# Patient Record
Sex: Female | Born: 1990 | Hispanic: Yes
Health system: Southern US, Community
[De-identification: ages and names within clinical notes are randomized; demographics above are authoritative.]

---

## 2013-03-15 ENCOUNTER — Emergency Department: Payer: Self-pay | Admitting: Emergency Medicine

## 2013-03-15 LAB — CBC WITH DIFFERENTIAL/PLATELET
Basophil #: 0.1 10*3/uL (ref 0.0–0.1)
Basophil %: 0.5 %
HCT: 38.2 % (ref 35.0–47.0)
Lymphocyte %: 12 %
MCH: 27.9 pg (ref 26.0–34.0)
MCHC: 33.2 g/dL (ref 32.0–36.0)
MCV: 84 fL (ref 80–100)
Monocyte %: 3.4 %
Neutrophil #: 13 10*3/uL — ABNORMAL HIGH (ref 1.4–6.5)
Neutrophil %: 83.8 %
Platelet: 180 10*3/uL (ref 150–440)
RBC: 4.53 10*6/uL (ref 3.80–5.20)
RDW: 13.8 % (ref 11.5–14.5)

## 2013-03-16 LAB — BASIC METABOLIC PANEL
Anion Gap: 7 (ref 7–16)
Calcium, Total: 8.9 mg/dL (ref 8.5–10.1)
Chloride: 103 mmol/L (ref 98–107)
Co2: 26 mmol/L (ref 21–32)
Creatinine: 0.86 mg/dL (ref 0.60–1.30)
EGFR (African American): >60
EGFR (Non-African Amer.): >60
Glucose: 126 mg/dL — ABNORMAL HIGH (ref 65–99)
Osmolality: 275 (ref 275–301)
Potassium: 4 mmol/L (ref 3.5–5.1)
Sodium: 136 mmol/L (ref 136–145)

## 2013-03-16 LAB — LIPASE, BLOOD: Lipase: 108 U/L (ref 73–393)

## 2013-03-16 LAB — HCG, QUANTITATIVE, PREGNANCY: Beta Hcg, Quant.: 8039 m[IU]/mL — ABNORMAL HIGH

## 2013-07-22 ENCOUNTER — Inpatient Hospital Stay: Payer: Self-pay | Admitting: Obstetrics and Gynecology

## 2013-07-22 LAB — URINALYSIS, COMPLETE
Bacteria: NONE SEEN
Bilirubin,UR: NEGATIVE
Blood: NEGATIVE
GLUCOSE, UR: NEGATIVE mg/dL (ref 0–75)
Leukocyte Esterase: NEGATIVE
Nitrite: NEGATIVE
PROTEIN: NEGATIVE
Ph: 5 (ref 4.5–8.0)
SPECIFIC GRAVITY: 1.02 (ref 1.003–1.030)
Squamous Epithelial: <1

## 2013-07-22 LAB — CBC WITH DIFFERENTIAL/PLATELET
BASOS PCT: 0.4 %
Basophil #: 0.1 10*3/uL (ref 0.0–0.1)
Basophil #: 0.1 10*3/uL (ref 0.0–0.1)
Basophil %: 0.7 %
EOS ABS: 0 10*3/uL (ref 0.0–0.7)
EOS PCT: 0.1 %
Eosinophil #: 0 10*3/uL (ref 0.0–0.7)
Eosinophil %: 0 %
HCT: 34.5 % — ABNORMAL LOW (ref 35.0–47.0)
HCT: 32.2 % — AB (ref 35.0–47.0)
HGB: 11.2 g/dL — ABNORMAL LOW (ref 12.0–16.0)
HGB: 10.7 g/dL — AB (ref 12.0–16.0)
LYMPHS ABS: 1.2 10*3/uL (ref 1.0–3.6)
Lymphocyte #: 0.9 10*3/uL — ABNORMAL LOW (ref 1.0–3.6)
Lymphocyte %: 6.6 %
Lymphocyte %: 5.3 %
MCH: 27.7 pg (ref 26.0–34.0)
MCH: 27.9 pg (ref 26.0–34.0)
MCHC: 32.5 g/dL (ref 32.0–36.0)
MCHC: 33.1 g/dL (ref 32.0–36.0)
MCV: 86 fL (ref 80–100)
MCV: 84 fL (ref 80–100)
MONO ABS: 0.5 x10 3/mm (ref 0.2–0.9)
Monocyte #: 0.7 x10 3/mm (ref 0.2–0.9)
Monocyte %: 2.6 %
Monocyte %: 3.8 %
NEUTROS ABS: 16.6 10*3/uL — AB (ref 1.4–6.5)
NEUTROS PCT: 90.2 %
Neutrophil #: 15.3 10*3/uL — ABNORMAL HIGH (ref 1.4–6.5)
Neutrophil %: 90.3 %
PLATELETS: 177 10*3/uL (ref 150–440)
PLATELETS: 142 10*3/uL — AB (ref 150–440)
RBC: 4.03 10*6/uL (ref 3.80–5.20)
RBC: 3.83 10*6/uL (ref 3.80–5.20)
RDW: 14.2 % (ref 11.5–14.5)
RDW: 14.1 % (ref 11.5–14.5)
WBC: 18.4 10*3/uL — ABNORMAL HIGH (ref 3.6–11.0)
WBC: 17 10*3/uL — AB (ref 3.6–11.0)

## 2013-07-22 LAB — COMPREHENSIVE METABOLIC PANEL
ALK PHOS: 55 U/L
Albumin: 3.2 g/dL — ABNORMAL LOW (ref 3.4–5.0)
Anion Gap: 7 (ref 7–16)
BILIRUBIN TOTAL: 0.6 mg/dL (ref 0.2–1.0)
BUN: 19 mg/dL — ABNORMAL HIGH (ref 7–18)
CALCIUM: 8.1 mg/dL — AB (ref 8.5–10.1)
CHLORIDE: 108 mmol/L — AB (ref 98–107)
CO2: 23 mmol/L (ref 21–32)
Creatinine: 0.84 mg/dL (ref 0.60–1.30)
EGFR (African American): >60
EGFR (Non-African Amer.): >60
GLUCOSE: 177 mg/dL — AB (ref 65–99)
Osmolality: 282 (ref 275–301)
POTASSIUM: 4.3 mmol/L (ref 3.5–5.1)
SGOT(AST): 17 U/L (ref 15–37)
SGPT (ALT): 16 U/L (ref 12–78)
SODIUM: 138 mmol/L (ref 136–145)
TOTAL PROTEIN: 6.9 g/dL (ref 6.4–8.2)

## 2013-07-22 LAB — HCG, QUANTITATIVE, PREGNANCY: Beta Hcg, Quant.: 2910 m[IU]/mL — ABNORMAL HIGH

## 2013-07-22 LAB — TROPONIN I: Troponin-I: <0.02 ng/mL

## 2013-07-22 LAB — PROTIME-INR
INR: 1.1
Prothrombin Time: 14.4 secs (ref 11.5–14.7)

## 2013-07-22 LAB — PHOSPHORUS: Phosphorus: 3.3 mg/dL (ref 2.5–4.9)

## 2013-07-22 LAB — MAGNESIUM: MAGNESIUM: 1.6 mg/dL — AB

## 2013-07-23 LAB — BASIC METABOLIC PANEL
Anion Gap: 6 — ABNORMAL LOW (ref 7–16)
BUN: 9 mg/dL (ref 7–18)
Calcium, Total: 6.8 mg/dL — CL (ref 8.5–10.1)
Chloride: 106 mmol/L (ref 98–107)
Co2: 24 mmol/L (ref 21–32)
Creatinine: 0.65 mg/dL (ref 0.60–1.30)
EGFR (African American): >60
EGFR (Non-African Amer.): >60
GLUCOSE: 160 mg/dL — AB (ref 65–99)
Osmolality: 274 (ref 275–301)
Potassium: 3.6 mmol/L (ref 3.5–5.1)
Sodium: 136 mmol/L (ref 136–145)

## 2013-07-23 LAB — CBC WITH DIFFERENTIAL/PLATELET
BASOS ABS: 0 10*3/uL (ref 0.0–0.1)
BASOS PCT: 0.3 %
EOS PCT: 0.1 %
Eosinophil #: 0 10*3/uL (ref 0.0–0.7)
HCT: 29.3 % — AB (ref 35.0–47.0)
HGB: 9.9 g/dL — AB (ref 12.0–16.0)
LYMPHS PCT: 10.8 %
Lymphocyte #: 1.2 10*3/uL (ref 1.0–3.6)
MCH: 28.2 pg (ref 26.0–34.0)
MCHC: 33.7 g/dL (ref 32.0–36.0)
MCV: 84 fL (ref 80–100)
MONO ABS: 0.7 x10 3/mm (ref 0.2–0.9)
Monocyte %: 6.5 %
Neutrophil #: 8.8 10*3/uL — ABNORMAL HIGH (ref 1.4–6.5)
Neutrophil %: 82.3 %
Platelet: 134 10*3/uL — ABNORMAL LOW (ref 150–440)
RBC: 3.49 10*6/uL — ABNORMAL LOW (ref 3.80–5.20)
RDW: 14.4 % (ref 11.5–14.5)
WBC: 10.6 10*3/uL (ref 3.6–11.0)

## 2013-07-23 LAB — HCG, QUANTITATIVE, PREGNANCY: Beta Hcg, Quant.: 1099 m[IU]/mL — ABNORMAL HIGH

## 2013-07-23 LAB — MAGNESIUM: MAGNESIUM: 1.3 mg/dL — AB

## 2013-07-24 LAB — COMPREHENSIVE METABOLIC PANEL
ALT: 14 U/L (ref 12–78)
AST: 17 U/L (ref 15–37)
Albumin: 2.3 g/dL — ABNORMAL LOW (ref 3.4–5.0)
Alkaline Phosphatase: 43 U/L — ABNORMAL LOW
Anion Gap: 3 — ABNORMAL LOW (ref 7–16)
BUN: 5 mg/dL — ABNORMAL LOW (ref 7–18)
Bilirubin,Total: 0.8 mg/dL (ref 0.2–1.0)
Calcium, Total: 7.2 mg/dL — ABNORMAL LOW (ref 8.5–10.1)
Chloride: 110 mmol/L — ABNORMAL HIGH (ref 98–107)
Co2: 26 mmol/L (ref 21–32)
Creatinine: 0.68 mg/dL (ref 0.60–1.30)
EGFR (African American): >60
EGFR (Non-African Amer.): >60
Glucose: 106 mg/dL — ABNORMAL HIGH (ref 65–99)
Osmolality: 275 (ref 275–301)
POTASSIUM: 3.3 mmol/L — AB (ref 3.5–5.1)
SODIUM: 139 mmol/L (ref 136–145)
Total Protein: 5.5 g/dL — ABNORMAL LOW (ref 6.4–8.2)

## 2013-07-24 LAB — CBC WITH DIFFERENTIAL/PLATELET
BASOS PCT: 0.6 %
Basophil #: 0.1 10*3/uL (ref 0.0–0.1)
EOS ABS: 0.1 10*3/uL (ref 0.0–0.7)
Eosinophil %: 0.9 %
HCT: 27.5 % — ABNORMAL LOW (ref 35.0–47.0)
HGB: 9.1 g/dL — AB (ref 12.0–16.0)
Lymphocyte #: 1.3 10*3/uL (ref 1.0–3.6)
Lymphocyte %: 14.5 %
MCH: 28.2 pg (ref 26.0–34.0)
MCHC: 33.3 g/dL (ref 32.0–36.0)
MCV: 85 fL (ref 80–100)
MONO ABS: 0.6 x10 3/mm (ref 0.2–0.9)
MONOS PCT: 6.9 %
Neutrophil #: 6.9 10*3/uL — ABNORMAL HIGH (ref 1.4–6.5)
Neutrophil %: 77.1 %
PLATELETS: 132 10*3/uL — AB (ref 150–440)
RBC: 3.24 10*6/uL — ABNORMAL LOW (ref 3.80–5.20)
RDW: 14.3 % (ref 11.5–14.5)
WBC: 9 10*3/uL (ref 3.6–11.0)

## 2013-07-24 LAB — MAGNESIUM: Magnesium: 1.8 mg/dL

## 2013-07-24 LAB — URINE CULTURE

## 2013-07-24 LAB — PATHOLOGY REPORT

## 2013-07-25 LAB — COMPREHENSIVE METABOLIC PANEL
ALBUMIN: 2.3 g/dL — AB (ref 3.4–5.0)
ALT: 14 U/L (ref 12–78)
Alkaline Phosphatase: 43 U/L — ABNORMAL LOW
Anion Gap: 3 — ABNORMAL LOW (ref 7–16)
BILIRUBIN TOTAL: 0.8 mg/dL (ref 0.2–1.0)
BUN: 4 mg/dL — AB (ref 7–18)
CO2: 28 mmol/L (ref 21–32)
CREATININE: 0.55 mg/dL — AB (ref 0.60–1.30)
Calcium, Total: 7.5 mg/dL — ABNORMAL LOW (ref 8.5–10.1)
Chloride: 110 mmol/L — ABNORMAL HIGH (ref 98–107)
EGFR (African American): >60
Glucose: 101 mg/dL — ABNORMAL HIGH (ref 65–99)
Osmolality: 278 (ref 275–301)
POTASSIUM: 3.8 mmol/L (ref 3.5–5.1)
SGOT(AST): 14 U/L — ABNORMAL LOW (ref 15–37)
SODIUM: 141 mmol/L (ref 136–145)
Total Protein: 5.5 g/dL — ABNORMAL LOW (ref 6.4–8.2)

## 2013-07-25 LAB — CBC WITH DIFFERENTIAL/PLATELET
BASOS PCT: 0.7 %
Basophil #: 0 10*3/uL (ref 0.0–0.1)
EOS ABS: 0.2 10*3/uL (ref 0.0–0.7)
Eosinophil %: 2.4 %
HCT: 26.6 % — AB (ref 35.0–47.0)
HGB: 8.8 g/dL — AB (ref 12.0–16.0)
Lymphocyte #: 1.7 10*3/uL (ref 1.0–3.6)
Lymphocyte %: 25.8 %
MCH: 28.5 pg (ref 26.0–34.0)
MCHC: 33.3 g/dL (ref 32.0–36.0)
MCV: 86 fL (ref 80–100)
MONO ABS: 0.4 x10 3/mm (ref 0.2–0.9)
Monocyte %: 6.7 %
Neutrophil #: 4.3 10*3/uL (ref 1.4–6.5)
Neutrophil %: 64.4 %
Platelet: 128 10*3/uL — ABNORMAL LOW (ref 150–440)
RBC: 3.11 10*6/uL — AB (ref 3.80–5.20)
RDW: 14.3 % (ref 11.5–14.5)
WBC: 6.6 10*3/uL (ref 3.6–11.0)

## 2013-07-25 LAB — MAGNESIUM: MAGNESIUM: 1.7 mg/dL — AB

## 2013-07-26 ENCOUNTER — Emergency Department: Payer: Self-pay | Admitting: Emergency Medicine

## 2013-07-26 LAB — COMPREHENSIVE METABOLIC PANEL
ALK PHOS: 54 U/L
ALT: 17 U/L (ref 12–78)
Albumin: 2.8 g/dL — ABNORMAL LOW (ref 3.4–5.0)
Anion Gap: 7 (ref 7–16)
BILIRUBIN TOTAL: 1.3 mg/dL — AB (ref 0.2–1.0)
BUN: 8 mg/dL (ref 7–18)
CALCIUM: 8.3 mg/dL — AB (ref 8.5–10.1)
CHLORIDE: 108 mmol/L — AB (ref 98–107)
Co2: 25 mmol/L (ref 21–32)
Creatinine: 0.71 mg/dL (ref 0.60–1.30)
EGFR (African American): >60
GLUCOSE: 91 mg/dL (ref 65–99)
Osmolality: 277 (ref 275–301)
Potassium: 3.7 mmol/L (ref 3.5–5.1)
SGOT(AST): 16 U/L (ref 15–37)
SODIUM: 140 mmol/L (ref 136–145)
TOTAL PROTEIN: 6.4 g/dL (ref 6.4–8.2)

## 2013-07-26 LAB — CBC WITH DIFFERENTIAL/PLATELET
BASOS ABS: 0.1 10*3/uL (ref 0.0–0.1)
Basophil %: 0.6 %
EOS PCT: 2.5 %
Eosinophil #: 0.2 10*3/uL (ref 0.0–0.7)
HCT: 29.4 % — ABNORMAL LOW (ref 35.0–47.0)
HGB: 9.6 g/dL — ABNORMAL LOW (ref 12.0–16.0)
LYMPHS ABS: 2.6 10*3/uL (ref 1.0–3.6)
LYMPHS PCT: 29.8 %
MCH: 27.6 pg (ref 26.0–34.0)
MCHC: 32.7 g/dL (ref 32.0–36.0)
MCV: 84 fL (ref 80–100)
MONO ABS: 0.5 x10 3/mm (ref 0.2–0.9)
Monocyte %: 5.4 %
Neutrophil #: 5.4 10*3/uL (ref 1.4–6.5)
Neutrophil %: 61.7 %
PLATELETS: 157 10*3/uL (ref 150–440)
RBC: 3.49 10*6/uL — ABNORMAL LOW (ref 3.80–5.20)
RDW: 13.9 % (ref 11.5–14.5)
WBC: 8.8 10*3/uL (ref 3.6–11.0)

## 2013-07-26 LAB — URINALYSIS, COMPLETE
BILIRUBIN, UR: NEGATIVE
GLUCOSE, UR: NEGATIVE mg/dL (ref 0–75)
Ketone: NEGATIVE
Leukocyte Esterase: NEGATIVE
NITRITE: NEGATIVE
PH: 7 (ref 4.5–8.0)
PROTEIN: NEGATIVE
SPECIFIC GRAVITY: 1.002 (ref 1.003–1.030)
Squamous Epithelial: 3
WBC UR: 3 /HPF (ref 0–5)

## 2013-07-26 LAB — PROTIME-INR
INR: 1
Prothrombin Time: 13.5 secs (ref 11.5–14.7)

## 2013-07-26 LAB — CK TOTAL AND CKMB (NOT AT ARMC)
CK, Total: 217 U/L — ABNORMAL HIGH
CK-MB: 0.6 ng/mL (ref 0.5–3.6)

## 2013-07-26 LAB — TROPONIN I: Troponin-I: <0.02 ng/mL

## 2013-07-27 LAB — CULTURE, BLOOD (SINGLE)

## 2014-08-10 NOTE — Op Note (Signed)
PATIENT NAME:  Glenda Rivera, Kaetlin MR#:  [id number] DATE OF BIRTH:  [birth year]  DATE OF PROCEDURE:  07/22/2013  PREOPERATIVE DIAGNOSIS: Ruptured ectopic pregnancy with a hemoperitoneum.   POSTOPERATIVE DIAGNOSES:  Ruptured ectopic pregnancy with a hemoperitoneum.  Appears to be right ovarian ectopic.   OPERATION PERFORMED: Right salpingo-oophorectomy via laparotomy.   ANESTHESIA USED: General.   PRIMARY SURGEON: Florina OuAndreas M. Bonney AidStaebler, MD  ESTIMATED BLOOD LOSS: 300 mL of hemoperitoneum and clot.   OPERATIVE FLUIDS: 1 liter of crystalloid.   URINE OUTPUT: 250 mL of urine output.   PREOPERATIVE ANTIBIOTICS: 1 gram of Ancef.   DRAINS OR TUBES:  Foley to gravity drainage.   IMPLANTS: None.   OTHER: 1 gram of Arista was applied to the right ovarian fossa.   INTRAOPERATIVE FINDINGS: Right ovary actively hemorrhaging upon establishing entry into the peritoneal cavity. There was some dilation in the right fallopian tube as well with significant reactive changes throughout the right ovarian fossa. The left tube and ovary appeared grossly normal. The uterus appeared normal as well. Of note, the patient also at the time of prep was noted to have foreign material versus something along the lines of pilonidal cyst in her umbilicus which was removed during cleaning of the umbilicus and sent to pathology.   COMPLICATIONS: None.   SPECIMENS REMOVED: Right tube and ovary as well as umbilical fibrous tissue.   CONDITION FOLLOWING THE PROCEDURE: Stable.   PROCEDURE IN DETAIL: Risks, benefits, and alternatives of the procedure were discussed with the patient prior to proceeding to the operating room. The patient had become hypotensive in the Emergency Department and had already received 1 liter of the emergency release  O negative blood by the time she was taken to the operating room. CT findings revealed a large amount of hemoperitoneum tracking from the pelvis up to the dome of the liver. There was no  definitively identified ectopic.  Her beta-hCG was 2000. The patient was emergently taken to the operating room and placed under general endotracheal anesthesia, prepped and draped in the usual sterile fashion, positioned in the supine position.  A Pfannenstiel skin incision was made on the skin using a scalpel carried down to the level of the rectus fascia using the knife. The fascia was incised in the midline with the knife and extended using Mayo scissors. The superior border of the rectus fascia was grasped with 2 Kocher clamps. The underlying rectus muscles were dissected off the fascia using blunt dissection. This was then repeated for the inferior border of the fascia in a similar fashion. The peritoneum was entered bluntly. Upon entering the peritoneum, a Balfour retractor was placed along with a Balfour extender to allow visualization. Inspection of the abdomen revealed the above findings. The left tube and ovary were grossly normal. The right tube had a small area of dilation and there was active hemorrhage from the right ovary. The ovarian fossa had appeared to be potentially involved at the site of implantation with significant reactive changes throughout the right ovarian fossa. The hemoperitoneum was evacuated using suction. Following this, the ovary was elevated using a Babcock clamp and then clamped using a Heaney clamp before being excised and suture ligated using a 0 Vicryl stitch. The left tube and ovary were once again inspected noting to be grossly normal in appearance. On inspection of the right tube, there was a dilation noted in the distal portion of the tube. Given that I could not exclude this as a potential site of the  ectopic as well, decision was made to remove the right distal portion of tube by elevating with a Babcock clamp and placing a free tie of 0 Vicryl underneath it before excising the specimen using Metzenbaum scissors. The pelvis was then once again inspected and noted to be  hemostatic. Given the reactive changes in the right ovarian fossa, 1 gram of Arista powder was applied to ensure hemostasis after closure. The muscles were reapproximated in the midline using a 0 Vicryl mattress stitch. The fascia was closed using a looped #1 PDS in a running fashion. The subcutaneous tissue was inspected and hemostasis achieved using the Bovie. Subcutaneous dead space in the skin was reapproximated using interrupted 2-0 Vicryls. The skin was closed using staples. Sponge, needle, and instrument counts were correct x 2. The patient tolerated procedure well and was taken to the recovery room in stable condition.      ____________________________ Florina Ou. Bonney Aid, MD ams:dd D: 07/22/2013 18:19:55 ET T: 07/22/2013 20:09:17 ET JOB#: [id number]  cc: Florina Ou. Bonney Aid, MD, <Dictator> [place] Cathrine Muster MD ELECTRONICALLY SIGNED 07/26/2013 9:26

## 2014-08-27 NOTE — H&P (Signed)
L&D Evaluation:  History:  HPI Patient is a 24 yo G1 presenting acutely to the ER with abdominal pain and vaginal bleeding, reports LMP of 4.1.15,  She states she has never had intercourse but BHCG is in the 2000's, CT scan reveals blood originating in pelvis and tracking up to the liver.  NPO since yesterday evening  GYN history - denies STI or abnormal paps, no prior abdominal/tubal surgery, no history of prior ectopics   Presents with abdominal pain   Patient's Medical History No Chronic Illness   Patient's Surgical History none   Medications none   Allergies NKDA   Social History none   Family History Non-Contributory   ROS:  ROS All systems were reviewed.  HEENT, CNS, GI, GU, Respiratory, CV, Renal and Musculoskeletal systems were found to be normal.   Exam:  Vital Signs stable   Urine Protein not completed   General no apparent distress   Mental Status clear   Chest clear   Heart normal sinus rhythm   Abdomen Hypoactive BS, guarding and rebound, RLQ tenderness   Edema no edema   Other admission Hgb 11.2, PT 1.1, BHCG 2000   Impression:  Impression 24 yo G1P0010 with unstable ruptured ectopic pregnancy and hemoperitoneum   Plan:  Comments - proceed to OR for laparotomy - 1 unit of emergency release blood - T&S pending may need rhogam - SCD's   Electronic Signatures: Lorrene ReidStaebler, Daryle Amis M (MD)  (Signed 05-Apr-15 15:20)  Authored: L&D Evaluation   Last Updated: 05-Apr-15 15:20 by Lorrene ReidStaebler, Angeleah Labrake M (MD)

## 2014-11-07 IMAGING — US US OB < 14 WEEKS - US OB TV
1 series · 14 of 28 positions shown · non-contrast
Comparison: None.

CLINICAL DATA: Right pelvic pain. Heavy vaginal bleeding. Uncertain
LMP.

EXAM:
OBSTETRIC <14 WK US AND TRANSVAGINAL OB US
TECHNIQUE: Both transabdominal and transvaginal ultrasound examinations were
performed for complete evaluation of the gestation as well as the
maternal uterus, adnexal regions, and pelvic cul-de-sac.
Transvaginal technique was performed to assess early pregnancy.

[Series 1: us ob < 14 weeks - us ob tv · 0.22mm/px · 41 acquisitions, 14 frames shown]
[im 2/41]
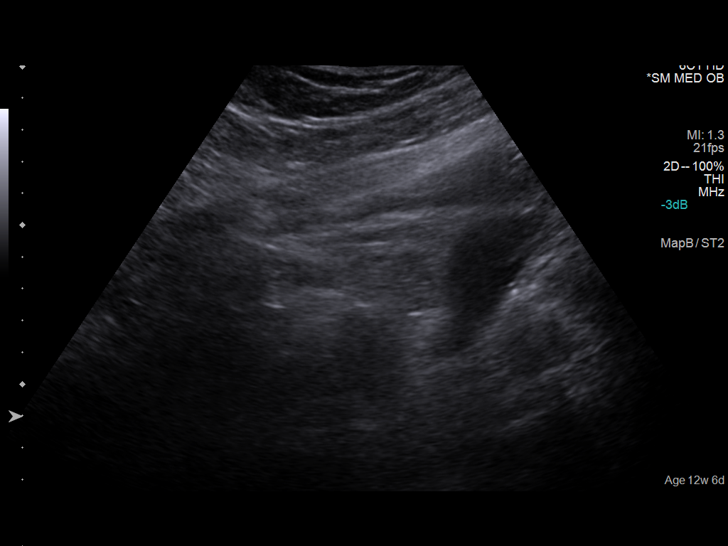
[im 5/41]
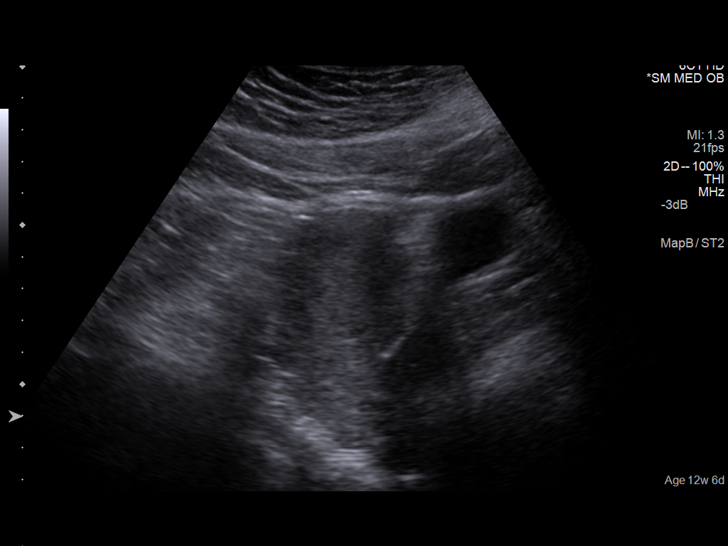
[im 8/41]
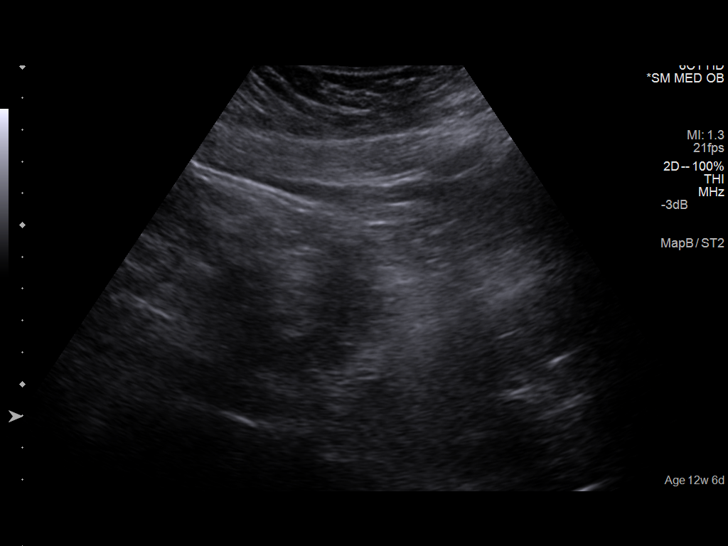
[im 11/41]
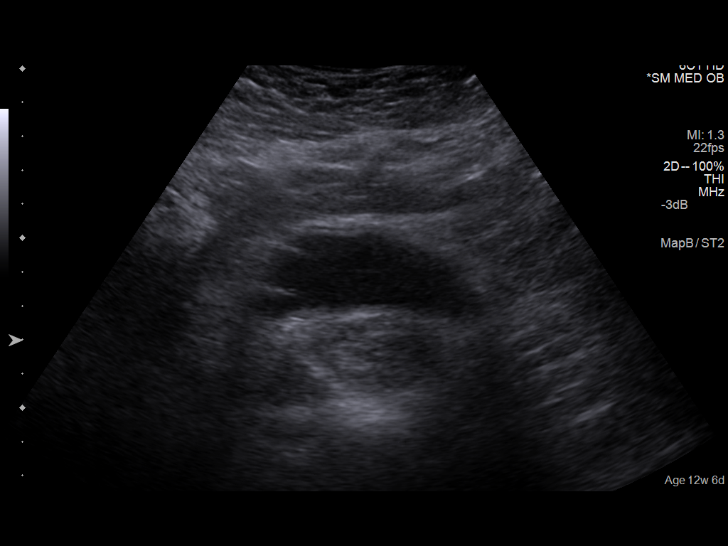
[im 14/41]
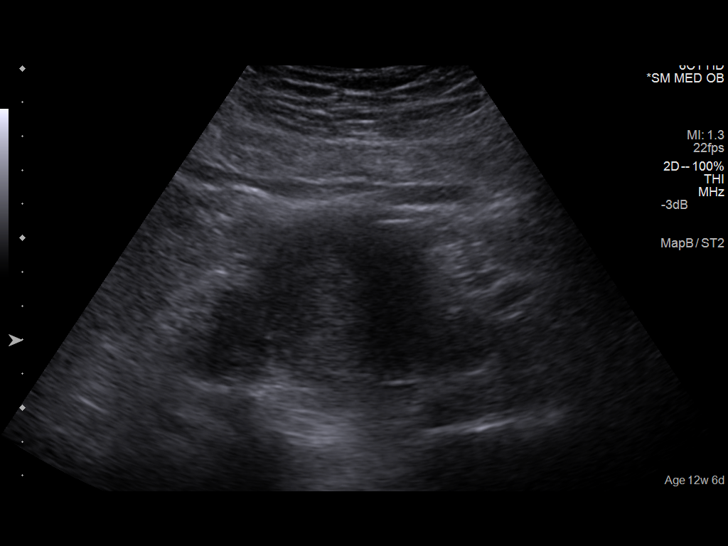
[im 17/41]
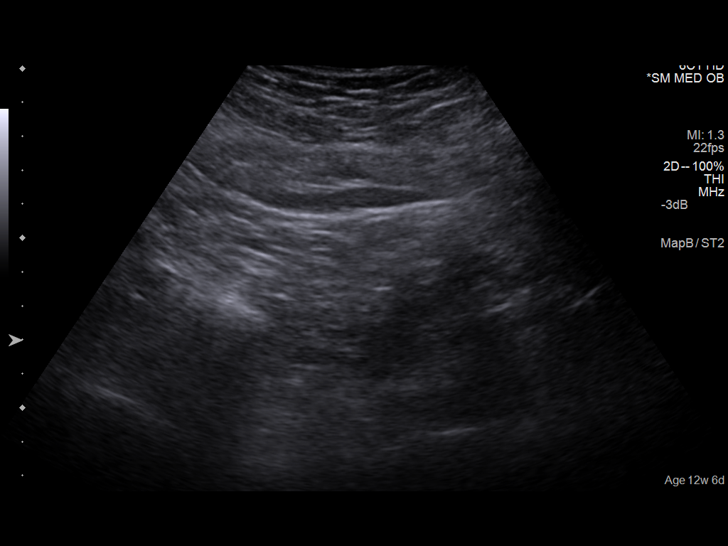
[im 20/41]
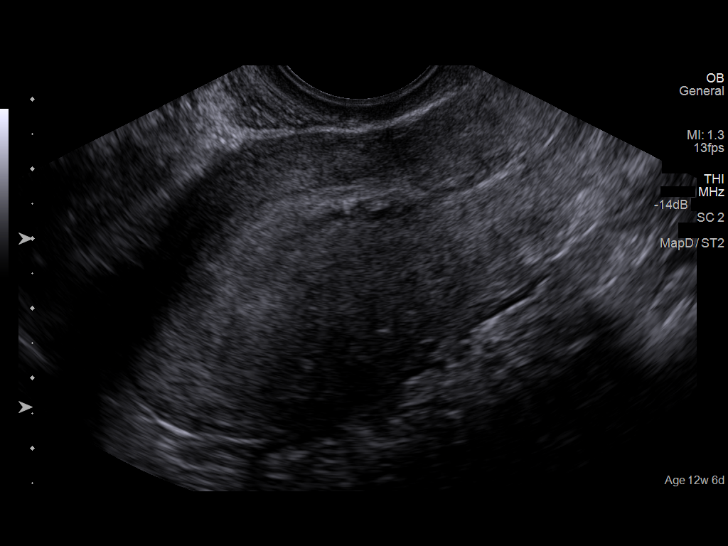
[im 23/41]
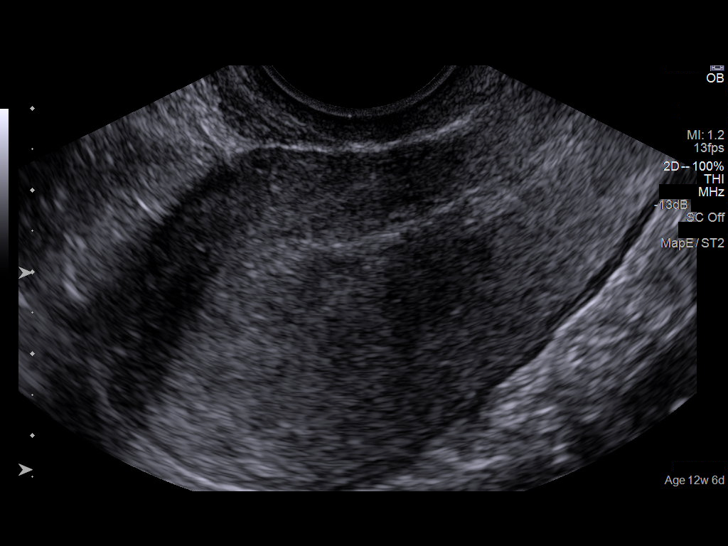
[im 26/41]
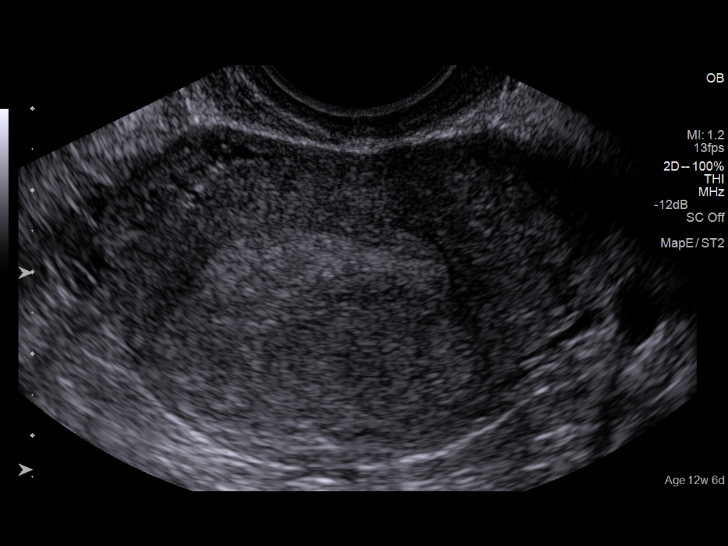
[im 29/41]
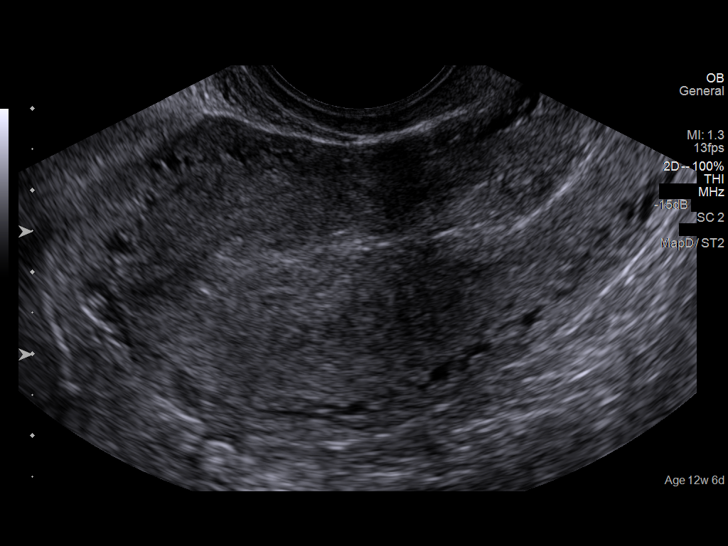
[im 32/41]
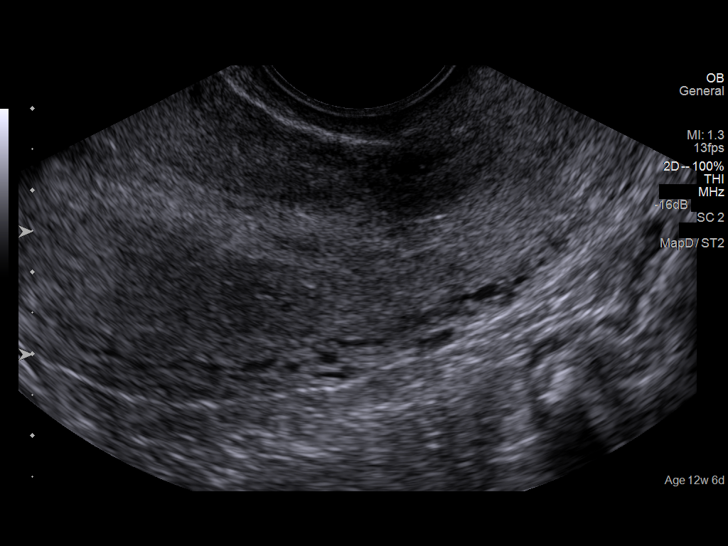
[im 35/41]
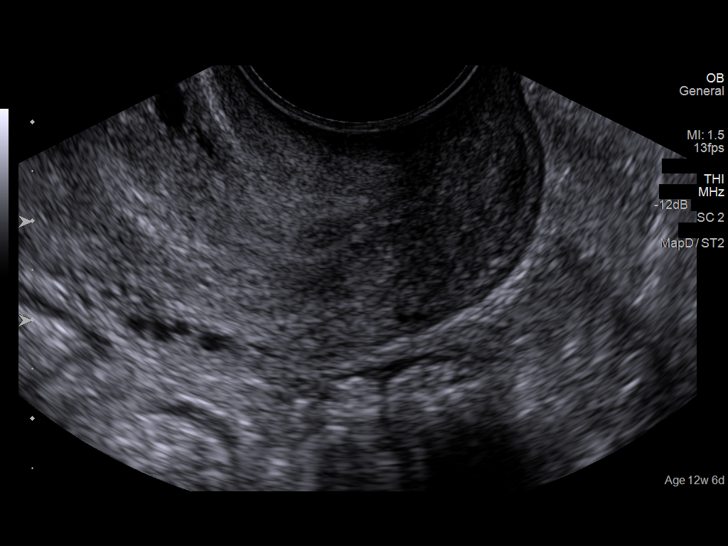
[im 38/41]
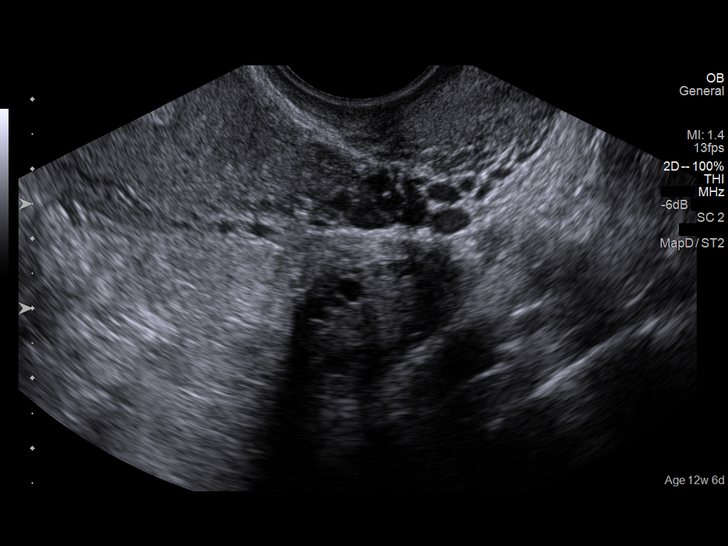
[im 41/41]
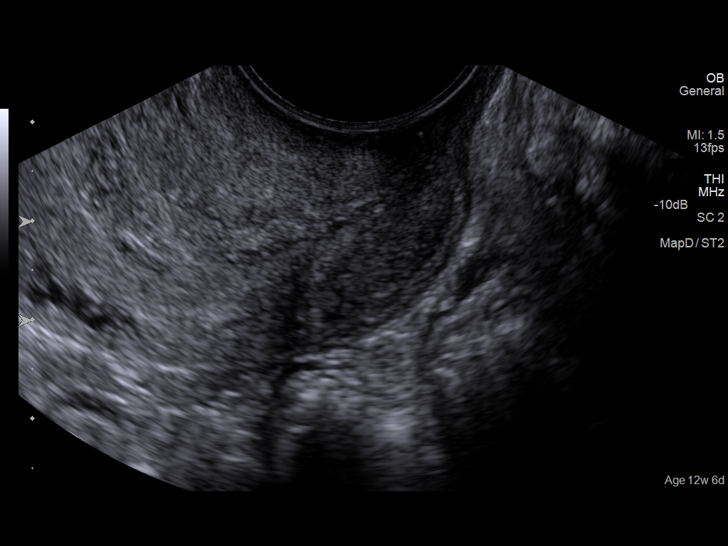

[14 of 28 positions shown; findings below may reference images not displayed]

FINDINGS: No intrauterine gestational sac or other fluid collection visualized
in endometrial cavity. No fibroids identified. Endometrial thickness
measures 13 mm.

Both ovaries are normal in appearance. No evidence of adnexal mass
or free fluid.
IMPRESSION: Pregnancy of unknown location. Differential diagnosis includes
recent spontaneous abortion, IUP too early to visualize, and occult
ectopic pregnancy. Recommend close follow up of quantitative B-HCG
levels, and follow up US as clinically warranted.

## 2015-03-19 IMAGING — CR DG CHEST 1V PORT
1 series · 1 of 1 positions shown · non-contrast
Comparison: DG CHEST 1V PORT dated 07/22/2013

CLINICAL DATA: Chest pain and weakness, recent hospital discharge.

EXAM:
PORTABLE CHEST - 1 VIEW

[ap]
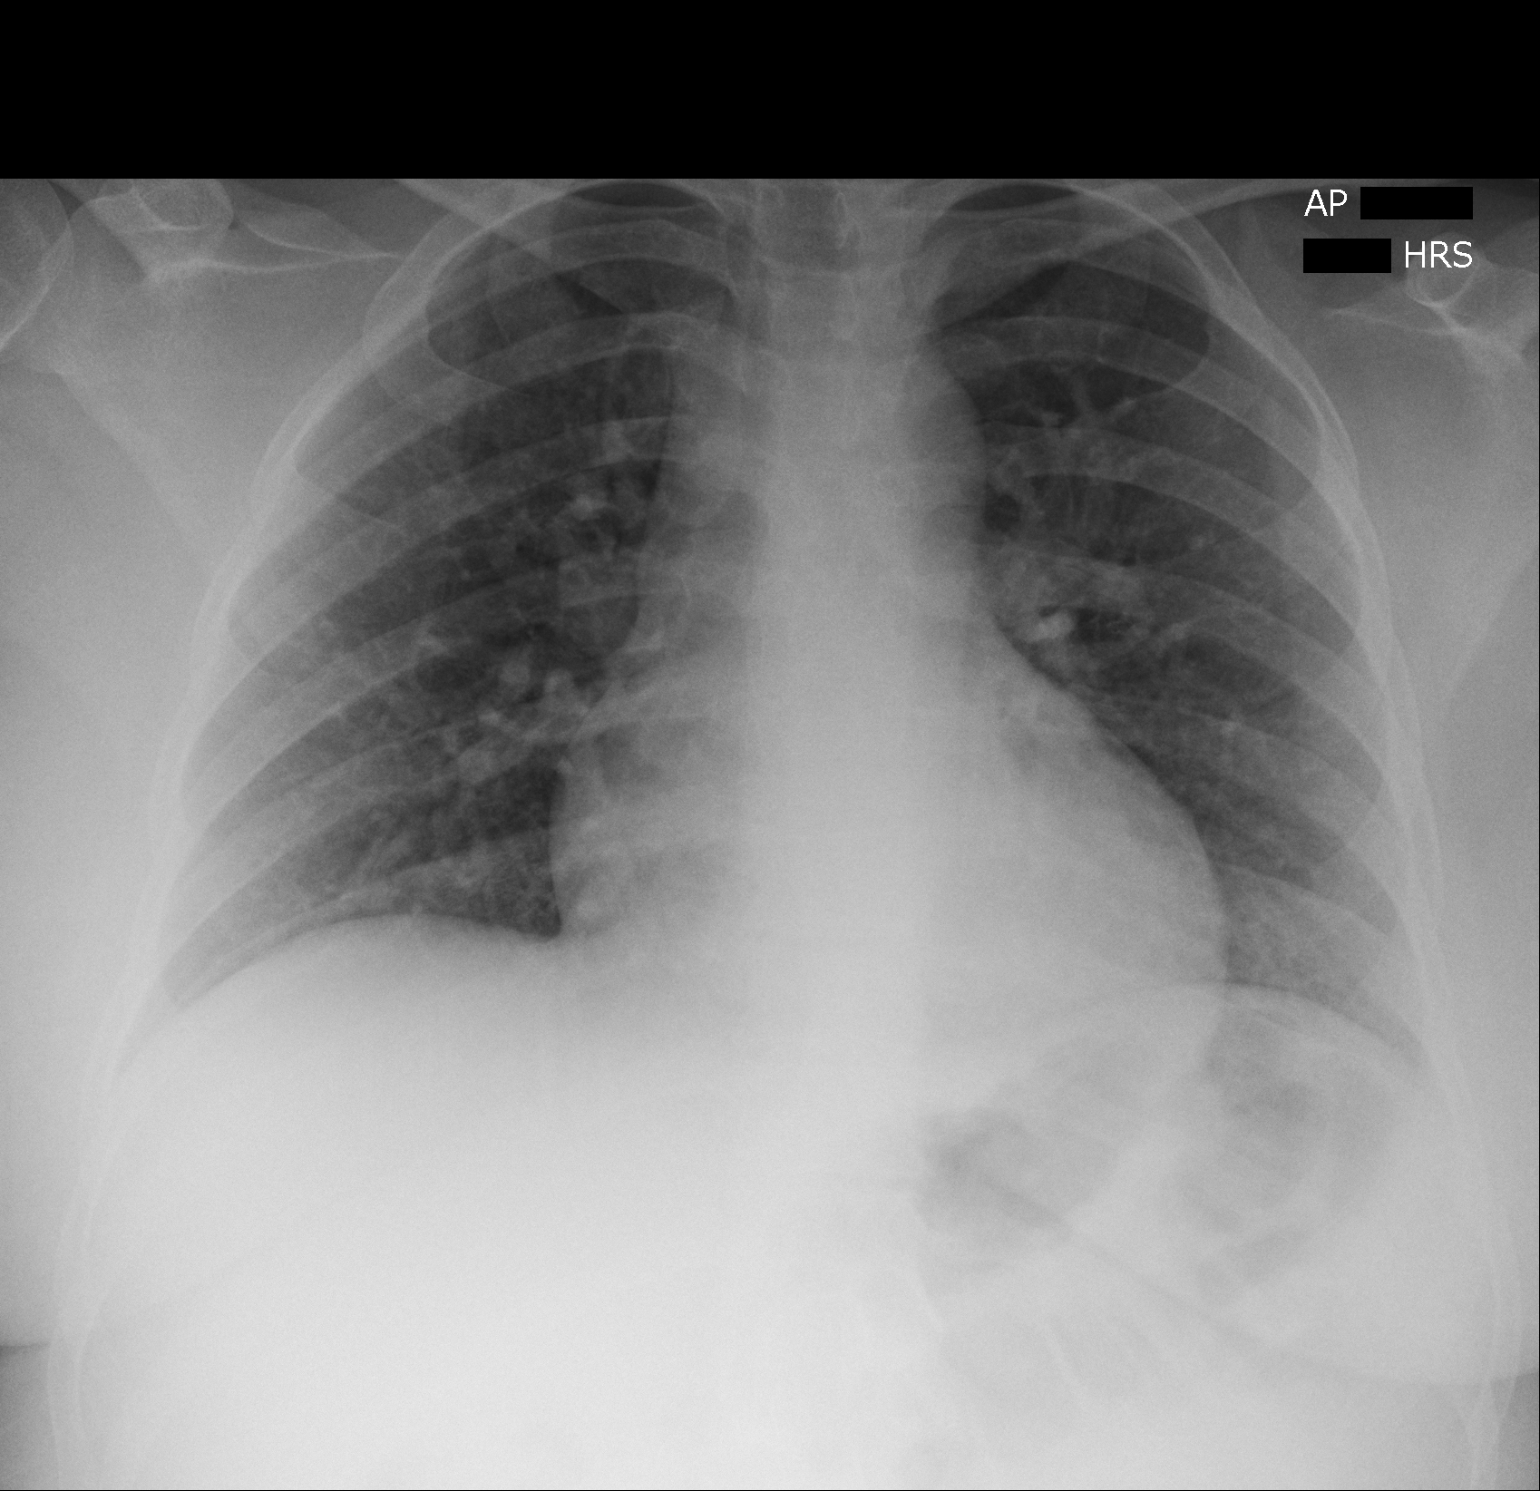

[1 of 1 positions shown; findings below may reference images not displayed]

FINDINGS: The heart size and mediastinal contours are within normal limits.
Both lungs are clear. The visualized skeletal structures are
unremarkable.
IMPRESSION: No active disease.

  By: Mambi Aletta
